# Patient Record
Sex: Female | Born: 1980 | Race: Black or African American | Hispanic: No | Marital: Single | Smoking: Current some day smoker
Health system: Southern US, Community
[De-identification: ages and names within clinical notes are randomized; demographics above are authoritative.]

---

## 2015-06-15 ENCOUNTER — Encounter (HOSPITAL_COMMUNITY): Payer: Self-pay | Admitting: Emergency Medicine

## 2015-06-15 ENCOUNTER — Emergency Department (HOSPITAL_COMMUNITY)
Admission: EM | Admit: 2015-06-15 | Discharge: 2015-06-16 | Disposition: A | Discharge status: Home / Self Care | Payer: Self-pay | Attending: Emergency Medicine | Admitting: Emergency Medicine

## 2015-06-15 DIAGNOSIS — N898 Other specified noninflammatory disorders of vagina: Secondary | ICD-10-CM | POA: Insufficient documentation

## 2015-06-15 DIAGNOSIS — R000 Tachycardia, unspecified: Secondary | ICD-10-CM | POA: Insufficient documentation

## 2015-06-15 DIAGNOSIS — F17200 Nicotine dependence, unspecified, uncomplicated: Secondary | ICD-10-CM | POA: Insufficient documentation

## 2015-06-15 DIAGNOSIS — B9789 Other viral agents as the cause of diseases classified elsewhere: Secondary | ICD-10-CM

## 2015-06-15 DIAGNOSIS — R300 Dysuria: Secondary | ICD-10-CM | POA: Insufficient documentation

## 2015-06-15 DIAGNOSIS — J069 Acute upper respiratory infection, unspecified: Secondary | ICD-10-CM | POA: Insufficient documentation

## 2015-06-15 DIAGNOSIS — Z3202 Encounter for pregnancy test, result negative: Secondary | ICD-10-CM | POA: Insufficient documentation

## 2015-06-15 DIAGNOSIS — R3915 Urgency of urination: Secondary | ICD-10-CM | POA: Insufficient documentation

## 2015-06-15 DIAGNOSIS — Z79899 Other long term (current) drug therapy: Secondary | ICD-10-CM | POA: Insufficient documentation

## 2015-06-15 LAB — URINALYSIS, ROUTINE W REFLEX MICROSCOPIC
BILIRUBIN URINE: NEGATIVE
GLUCOSE, UA: NEGATIVE mg/dL
HGB URINE DIPSTICK: NEGATIVE
KETONES UR: NEGATIVE mg/dL
Leukocytes, UA: NEGATIVE
NITRITE: NEGATIVE
Protein, ur: NEGATIVE mg/dL
Specific Gravity, Urine: 1.01 (ref 1.005–1.030)
pH: 5.5 (ref 5.0–8.0)

## 2015-06-15 LAB — POC URINE PREG, ED: Preg Test, Ur: NEGATIVE

## 2015-06-15 NOTE — ED Notes (Signed)
Patient arrives with complaint of urinary urgency, dysuria, and upper respiratory illness. Onset 3 days ago. Denies fever, but states she feels cold in triage.

## 2015-06-15 NOTE — ED Provider Notes (Signed)
CSN: [id number]     Arrival date & time 06/15/15  2202 History   First MD Initiated Contact with Patient 06/15/15 2335     Chief Complaint  Patient presents with  . Urinary Urgency  . Dysuria  . URI     (Consider location/radiation/quality/duration/timing/severity/associated sxs/prior Treatment) The history is provided by the patient and medical records. No language interpreter was used.     Sabrina Mercado is a 35 y.o. female  With no personal medical hx presents to the Emergency Department complaining of gradual, persistent, progressively worsening dysuria, urinary urgency onset 3 days ago. Patient reports she thought this was a urinary tract infection. She reports drinking cranberry juice and lots of water. She also reports inserting probiotic yogurt into her vagina and attempt to treat this possible UTI. Patient has one female sexual partner. No history of STDs.  Patient reports some vaginal discharge although she cannot remember if this was before or after the yogurt insertion.  She reports using water-based lubricant during sexual intercourse. She does report intermittent douche usage.  Pt also c/o gradual onset, persistent URI x 4 days with associated rhinorrhea, nonproductive cough, nasal congestion, denies and throbbing headache.  She has been taking Mucinex, decongestants and cough/cold medications without significant improvement. Patient reports that her partner had a similar illness several weeks ago.  She denies fever, chills, neck pain, neck stiffness, chest pain, shortness of breath.     History reviewed. No pertinent past medical history. History reviewed. No pertinent past surgical history. History reviewed. No pertinent family history. Social History  Substance Use Topics  . Smoking status: Current Some Day Smoker  . Smokeless tobacco: None  . Alcohol Use: No   OB History    No data available     Review of Systems  Constitutional: Negative for fever, chills,  diaphoresis, appetite change, fatigue and unexpected weight change.  HENT: Positive for congestion, postnasal drip, rhinorrhea, sinus pressure and sore throat. Negative for ear discharge, ear pain and mouth sores.   Eyes: Negative for visual disturbance.  Respiratory: Positive for cough (dry). Negative for chest tightness, shortness of breath, wheezing and stridor.   Cardiovascular: Negative for chest pain, palpitations and leg swelling.  Gastrointestinal: Negative for nausea, vomiting, abdominal pain, diarrhea and constipation.  Endocrine: Negative for polydipsia, polyphagia and polyuria.  Genitourinary: Positive for dysuria, urgency and vaginal discharge. Negative for frequency and hematuria.  Musculoskeletal: Negative for myalgias, back pain, arthralgias and neck stiffness.  Skin: Negative for rash.  Allergic/Immunologic: Negative for immunocompromised state.  Neurological: Positive for headaches. Negative for syncope, light-headedness and numbness.  Hematological: Negative for adenopathy. Does not bruise/bleed easily.  Psychiatric/Behavioral: Negative for sleep disturbance. The patient is not nervous/anxious.   All other systems reviewed and are negative.     Allergies  Review of patient's allergies indicates no known allergies.  Home Medications   Prior to Admission medications   Medication Sig Start Date End Date Taking? Authorizing Provider  Fexofenadine HCl (ALLEGRA PO) Take 1 tablet by mouth daily.   Yes Historical Provider, MD  fluconazole (DIFLUCAN) 150 MG tablet Take 1 tablet (150 mg total) by mouth once. In 72 hours if no improvement 06/16/15   Jia Dottavio, PA-C  metroNIDAZOLE (FLAGYL) 500 MG tablet Take 1 tablet (500 mg total) by mouth 2 (two) times daily. 06/16/15   Libia Fazzini, PA-C   BP 121/73 mmHg  Pulse 105  Temp(Src) 98.8 F (37.1 C) (Oral)  Resp 18  Ht 5' 5.5" (1.664 m)  Wt 79.379 kg  BMI 28.67 kg/m2  SpO2 100%  LMP 06/02/2015 (Exact  Date) Physical Exam  Constitutional: She is oriented to person, place, and time. She appears well-developed and well-nourished. No distress.  HENT:  Head: Normocephalic and atraumatic.  Right Ear: Tympanic membrane, external ear and ear canal normal.  Left Ear: Tympanic membrane, external ear and ear canal normal.  Nose: Mucosal edema and rhinorrhea present. No epistaxis. Right sinus exhibits no maxillary sinus tenderness and no frontal sinus tenderness. Left sinus exhibits no maxillary sinus tenderness and no frontal sinus tenderness.  Mouth/Throat: Uvula is midline and mucous membranes are normal. Mucous membranes are not pale and not cyanotic. No oropharyngeal exudate, posterior oropharyngeal edema, posterior oropharyngeal erythema or tonsillar abscesses.  Eyes: Conjunctivae are normal. Pupils are equal, round, and reactive to light.  Neck: Normal range of motion and full passive range of motion without pain.  Cardiovascular: Normal rate, regular rhythm, normal heart sounds and intact distal pulses.   No murmur heard. Pulmonary/Chest: Effort normal and breath sounds normal. No stridor. No respiratory distress. She has no wheezes.  Clear and equal breath sounds without focal wheezes, rhonchi, rales  Abdominal: Soft. Bowel sounds are normal. There is no tenderness. There is no rebound and no guarding. Hernia confirmed negative in the right inguinal area and confirmed negative in the left inguinal area.  Genitourinary: Uterus normal. No labial fusion. There is no rash, tenderness or lesion on the right labia. There is no rash, tenderness or lesion on the left labia. Uterus is not deviated, not enlarged, not fixed and not tender. Cervix exhibits no motion tenderness, no discharge and no friability. Right adnexum displays no mass, no tenderness and no fullness. Left adnexum displays no mass, no tenderness and no fullness. No erythema, tenderness or bleeding in the vagina. No foreign body around the  vagina. No signs of injury around the vagina. Vaginal discharge (thick, clumpy, white) found.  Musculoskeletal: Normal range of motion. She exhibits no edema.  Lymphadenopathy:    She has no cervical adenopathy.       Right: No inguinal adenopathy present.       Left: No inguinal adenopathy present.  Neurological: She is alert and oriented to person, place, and time.  Skin: Skin is warm and dry. No rash noted. She is not diaphoretic. No erythema.  Psychiatric: She has a normal mood and affect.  Nursing note and vitals reviewed.   ED Course  Procedures (including critical care time) Labs Review Labs Reviewed  WET PREP, GENITAL  URINALYSIS, ROUTINE W REFLEX MICROSCOPIC (NOT AT [facility])  POC URINE PREG, ED  GC/CHLAMYDIA PROBE AMP ([place]) NOT AT [facility]     MDM   Final diagnoses:  Viral URI with cough  Tachycardia  Vaginal discharge  Dysuria   Sabrina Mercado presents with several complaints.  Pt afebrile with clear and equal breath sounds; doubt PNA.  No CXR indicated at this time. Patients symptoms are consistent with URI, likely viral etiology. Discussed that antibiotics are not indicated for viral infections. Pt will be discharged with symptomatic treatment.  Verbalizes understanding and is agreeable with plan.  Patient with noted tachycardia and low 100s.  She has no risk factors for PE including no estrogen usage, swelling of either leg, hx of DVT, recent immobilization, surgery or fracture.  She denies CP or SOB.  Pt given fluid bolus.  NO hypoxia.    Pt also with c/o urinary urgency and dysuria.  UA without evidence of  UTI.  Pelvic exam with vaginal discharge consistent with yeast.  NO odor of BV.  Per RN, the top of the tube containing the wet prep became dislodged in transit and was therefore contaminated.  I discussed this with the patient and offered to recollect the sample.  She declines.  We have treated for yeast and this is present clinically.  I discussed the possibility of  BV but clinical exam more likely yeast.  Pt wishes to be treated for BV at this time without verification on wet prep.  Pt d/c home with flagyl.  Discussed importance of refraining from EtOH while on this medication.  [facility]    BP 122/72 mmHg  Pulse 104  Temp(Src) 98.8 F (37.1 C) (Oral)  Resp 16  Ht 5' 5.5" (1.664 m)  Wt 79.379 kg  BMI 28.67 kg/m2  SpO2 100%  LMP 06/02/2015 (Exact Date)   Dierdre ForthHannah Jamont Mellin, PA-C 06/16/15 [id number]  Alvira MondayErin Schlossman, MD 06/16/15 [phone]

## 2015-06-16 MED ORDER — FLUTICASONE PROPIONATE 50 MCG/ACT NA SUSP
2.0000 | Freq: Every day | NASAL | Status: DC
  Administered 2015-06-16: 2 via NASAL
  Filled 2015-06-16: qty 16

## 2015-06-16 MED ORDER — FLUCONAZOLE 100 MG PO TABS
150.0000 mg | ORAL_TABLET | Freq: Once | ORAL | Status: AC
  Administered 2015-06-16: 150 mg via ORAL
  Filled 2015-06-16: qty 2

## 2015-06-16 MED ORDER — FLUCONAZOLE 150 MG PO TABS: 150.0000 mg | ORAL_TABLET | Freq: Once | ORAL | Status: AC

## 2015-06-16 MED ORDER — METRONIDAZOLE 500 MG PO TABS: 500.0000 mg | ORAL_TABLET | Freq: Two times a day (BID) | ORAL | Status: AC

## 2015-06-16 MED ORDER — IBUPROFEN 400 MG PO TABS
400.0000 mg | ORAL_TABLET | Freq: Once | ORAL | Status: AC
  Administered 2015-06-16: 400 mg via ORAL
  Filled 2015-06-16: qty 1

## 2015-06-16 MED ORDER — SODIUM CHLORIDE 0.9 % IV BOLUS (SEPSIS)
1000.0000 mL | Freq: Once | INTRAVENOUS | Status: AC
  Administered 2015-06-16: 1000 mL via INTRAVENOUS

## 2015-06-16 NOTE — Discharge Instructions (Signed)
1. Medications: flonase, mucinex, diflucan, flagyl, usual home medications 2. Treatment: rest, drink plenty of fluids, take tylenol or ibuprofen for fever control 3. Follow Up: Please followup with your primary doctor in 3 days for discussion of your diagnoses and further evaluation after today's visit; if you do not have a primary care doctor use the resource guide provided to find one; Return to the ER for high fevers, difficulty breathing or other concerning symptoms   Bacterial Vaginosis Bacterial vaginosis is a vaginal infection that occurs when the normal balance of bacteria in the vagina is disrupted. It results from an overgrowth of certain bacteria. This is the most common vaginal infection in women of childbearing age. Treatment is important to prevent complications, especially in pregnant women, as it can cause a premature delivery. CAUSES  Bacterial vaginosis is caused by an increase in harmful bacteria that are normally present in smaller amounts in the vagina. Several different kinds of bacteria can cause bacterial vaginosis. However, the reason that the condition develops is not fully understood. RISK FACTORS Certain activities or behaviors can put you at an increased risk of developing bacterial vaginosis, including:  Having a new sex partner or multiple sex partners.  Douching.  Using an intrauterine device (IUD) for contraception. Women do not get bacterial vaginosis from toilet seats, bedding, swimming pools, or contact with objects around them. SIGNS AND SYMPTOMS  Some women with bacterial vaginosis have no signs or symptoms. Common symptoms include:  Grey vaginal discharge.  A fishlike odor with discharge, especially after sexual intercourse.  Itching or burning of the vagina and vulva.  Burning or pain with urination. DIAGNOSIS  Your health care provider will take a medical history and examine the vagina for signs of bacterial vaginosis. A sample of vaginal fluid  may be taken. Your health care provider will look at this sample under a microscope to check for bacteria and abnormal cells. A vaginal pH test may also be done.  TREATMENT  Bacterial vaginosis may be treated with antibiotic medicines. These may be given in the form of a pill or a vaginal cream. A second round of antibiotics may be prescribed if the condition comes back after treatment. Because bacterial vaginosis increases your risk for sexually transmitted diseases, getting treated can help reduce your risk for chlamydia, gonorrhea, HIV, and herpes. HOME CARE INSTRUCTIONS   Only take over-the-counter or prescription medicines as directed by your health care provider.  If antibiotic medicine was prescribed, take it as directed. Make sure you finish it even if you start to feel better.  Tell all sexual partners that you have a vaginal infection. They should see their health care provider and be treated if they have problems, such as a mild rash or itching.  During treatment, it is important that you follow these instructions:  Avoid sexual activity or use condoms correctly.  Do not douche.  Avoid alcohol as directed by your health care provider.  Avoid breastfeeding as directed by your health care provider. SEEK MEDICAL CARE IF:   Your symptoms are not improving after 3 days of treatment.  You have increased discharge or pain.  You have a fever. MAKE SURE YOU:   Understand these instructions.  Will watch your condition.  Will get help right away if you are not doing well or get worse. FOR MORE INFORMATION  Centers for Disease Control and Prevention, Division of STD Prevention: [employer] American Sexual Health Association (ASHA): www.ashastd.org    This information is not intended to  replace advice given to you by your health care provider. Make sure you discuss any questions you have with your health care provider.   Document Released: 02/16/2005 Document Revised:  03/09/2014 Document Reviewed: 09/28/2012 Elsevier Interactive Patient Education [employer]2016 Elsevier Inc.

## 2015-06-16 NOTE — ED Notes (Signed)
Pelvic cart available the the bedside.

## 2015-06-16 NOTE — ED Notes (Signed)
Wet prep was open on arrival to lab

## 2015-06-17 LAB — GC/CHLAMYDIA PROBE AMP (~~LOC~~) NOT AT ARMC
Chlamydia: NEGATIVE
NEISSERIA GONORRHEA: NEGATIVE

## 2015-07-18 ENCOUNTER — Emergency Department (HOSPITAL_COMMUNITY): Payer: Self-pay

## 2015-07-18 ENCOUNTER — Emergency Department (HOSPITAL_COMMUNITY)
Admission: EM | Admit: 2015-07-18 | Discharge: 2015-07-18 | Disposition: A | Discharge status: Home / Self Care | Payer: Self-pay | Attending: Emergency Medicine | Admitting: Emergency Medicine

## 2015-07-18 ENCOUNTER — Encounter (HOSPITAL_COMMUNITY): Payer: Self-pay | Admitting: Emergency Medicine

## 2015-07-18 DIAGNOSIS — Z79899 Other long term (current) drug therapy: Secondary | ICD-10-CM | POA: Insufficient documentation

## 2015-07-18 DIAGNOSIS — F17200 Nicotine dependence, unspecified, uncomplicated: Secondary | ICD-10-CM | POA: Insufficient documentation

## 2015-07-18 DIAGNOSIS — R519 Headache, unspecified: Secondary | ICD-10-CM

## 2015-07-18 DIAGNOSIS — R51 Headache: Secondary | ICD-10-CM | POA: Insufficient documentation

## 2015-07-18 MED ORDER — DIPHENHYDRAMINE HCL 50 MG/ML IJ SOLN
25.0000 mg | Freq: Once | INTRAMUSCULAR | Status: AC
  Administered 2015-07-18: 25 mg via INTRAVENOUS
  Filled 2015-07-18: qty 1

## 2015-07-18 MED ORDER — KETOROLAC TROMETHAMINE 30 MG/ML IJ SOLN
30.0000 mg | Freq: Once | INTRAMUSCULAR | Status: AC
  Administered 2015-07-18: 30 mg via INTRAVENOUS
  Filled 2015-07-18: qty 1

## 2015-07-18 MED ORDER — SODIUM CHLORIDE 0.9 % IV BOLUS (SEPSIS)
500.0000 mL | Freq: Once | INTRAVENOUS | Status: AC
  Administered 2015-07-18: 500 mL via INTRAVENOUS

## 2015-07-18 NOTE — ED Provider Notes (Signed)
CSN: [id number]     Arrival date & time 07/18/15  0930 History   First MD Initiated Contact with Patient 07/18/15 1001     Chief Complaint  Patient presents with  . Headache    (Consider location/radiation/quality/duration/timing/severity/associated sxs/prior Treatment) Patient is a 35 y.o. female presenting with headaches. The history is provided by the patient and medical records. No language interpreter was used.  Headache Associated symptoms: no abdominal pain, no back pain, no congestion, no cough, no dizziness, no fever, no nausea, no neck pain, no neck stiffness, no sore throat, no vomiting and no weakness    Alauna C Oxendine is a 35 y.o. female  who presents to the Emergency Department complaining of a constant headache over the last one month. Ranges from 6/10 - 10/10 but always present. No hx of headaches in the past. Ibuprofen, Tylenol, and Excedrin taken with relief for approximately 30 minutes, but then pain returns. Described as throbbing and generalized over entire head, worst frontal. Intermittent nausea but not at present, increase in stress recently. No visual changes, no gait abnormalities, no photophobia, no fever, no neck pain.   History reviewed. No pertinent past medical history. History reviewed. No pertinent past surgical history. No family history on file. Social History  Substance Use Topics  . Smoking status: Current Some Day Smoker  . Smokeless tobacco: None  . Alcohol Use: Yes   OB History    No data available     Review of Systems  Constitutional: Negative for fever and chills.  HENT: Negative for congestion and sore throat.   Eyes: Negative for visual disturbance.  Respiratory: Negative for cough and shortness of breath.   Cardiovascular: Negative.   Gastrointestinal: Negative for nausea, vomiting and abdominal pain.  Genitourinary: Negative for dysuria.  Musculoskeletal: Negative for back pain, neck pain and neck stiffness.  Skin: Negative for rash.   Neurological: Positive for headaches. Negative for dizziness, speech difficulty and weakness.      Allergies  Review of patient's allergies indicates no known allergies.  Home Medications   Prior to Admission medications   Medication Sig Start Date End Date Taking? Authorizing Provider  Fexofenadine HCl (ALLEGRA PO) Take 1 tablet by mouth daily.    Historical Provider, MD  fluconazole (DIFLUCAN) 150 MG tablet Take 1 tablet (150 mg total) by mouth once. In 72 hours if no improvement Patient not taking: Reported on 07/18/2015 06/16/15   Dahlia ClientHannah Muthersbaugh, PA-C  metroNIDAZOLE (FLAGYL) 500 MG tablet Take 1 tablet (500 mg total) by mouth 2 (two) times daily. Patient not taking: Reported on 07/18/2015 06/16/15   Dahlia ClientHannah Muthersbaugh, PA-C   BP 103/50 mmHg  Pulse 68  Temp(Src) 98.4 F (36.9 C) (Oral)  Resp 16  Wt 79.833 kg  SpO2 99%  LMP 06/29/2015 Physical Exam  Constitutional: She is oriented to person, place, and time. She appears well-developed and well-nourished. No distress.  HENT:  Head: Normocephalic and atraumatic.  Mouth/Throat: Oropharynx is clear and moist.  No tenderness of the temporal artery   Eyes: Conjunctivae and EOM are normal. Pupils are equal, round, and reactive to light. No scleral icterus.  No nystagmus   Neck: Normal range of motion. Neck supple.  Full active and passive ROM without pain.  No midline or paraspinal tenderness. No nuchal rigidity or meningeal signs.  Cardiovascular: Normal rate, regular rhythm, normal heart sounds and intact distal pulses.   Pulmonary/Chest: Effort normal and breath sounds normal. No respiratory distress. She has no wheezes. She has no  rales.  Abdominal: Soft. She exhibits no distension. There is no tenderness.  Musculoskeletal: Normal range of motion.  Lymphadenopathy:    She has no cervical adenopathy.  Neurological: She is alert and oriented to person, place, and time. She has normal reflexes. No cranial nerve deficit.  Coordination normal.  Alert, oriented, thought content appropriate, able to give a coherent history. Speech is clear and goal oriented, able to follow commands.  Cranial Nerves:  II:  Peripheral visual fields grossly normal, pupils equal, round, reactive to light III, IV, VI: EOM intact bilaterally, ptosis not present V,VII: smile symmetric, eyes kept closed tightly against resistance, facial light touch sensation equal VIII: hearing grossly normal IX, X: symmetric soft palate movement, uvula elevates symmetrically  XI: bilateral shoulder shrug symmetric and strong XII: midline tongue extension 5/5 muscle strength in upper and lower extremities bilaterally including strong and equal grip strength and dorsiflexion/plantar flexion Sensory to light touch normal in all four extremities.  Normal finger-to-nose and rapid alternating movements; normal gait and balance  Skin: Skin is warm and dry. No rash noted. She is not diaphoretic.  Psychiatric: She has a normal mood and affect. Her behavior is normal. Judgment and thought content normal.  Nursing note and vitals reviewed.   ED Course  Procedures (including critical care time) Labs Review Labs Reviewed - No data to display  Imaging Review Ct Head Wo Contrast  07/18/2015  CLINICAL DATA:  35 year old female with headache for 1 month. Initial encounter. EXAM: CT HEAD WITHOUT CONTRAST TECHNIQUE: Contiguous axial images were obtained from the base of the skull through the vertex without intravenous contrast. COMPARISON:  None. FINDINGS: Visualized paranasal sinuses and mastoids are clear. No osseous abnormality identified. No acute orbit or scalp soft tissue findings. Cerebral volume is normal. No midline shift, ventriculomegaly, mass effect, evidence of mass lesion, intracranial hemorrhage or evidence of cortically based acute infarction. Gray-white matter differentiation is within normal limits throughout the brain. Incidental small perivascular  space at the inferior left basal ganglia. No suspicious intracranial vascular hyperdensity. IMPRESSION: Normal noncontrast CT appearance of the brain. Electronically Signed   By: Odessa Fleming M.D.   On: 07/18/2015 11:12   I have personally reviewed and evaluated these images and lab results as part of my medical decision-making.   EKG Interpretation None      MDM   Final diagnoses:  Nonintractable headache, unspecified chronicity pattern, unspecified headache type   Deatrice C Swindle presents to ED for constant headache x 1 month. Pain will range from 6/10-10/10 but always present. No history of similar headaches. On exam, afebrile, VSS. No focal neuro deficits. No meningeal signs. Given duration of symptoms and no headache hx, will obtain CT head. Toradol and benadryl given for headache.   CT head unremarkable.   12:15 PM - Patient re-evaluated and states headache has resolved.   Follow up with PCP strongly encouraged. Return precautions given. All questions answered.   [facility] Steven Basso, PA-C 07/18/15 1237  Lavera Guise, MD 07/18/15 [phone]

## 2015-07-18 NOTE — Discharge Instructions (Signed)
1. Medications: continue usual home medications 2. Treatment: rest, drink plenty of fluids 3. Follow Up: Please follow up with your primary doctor in 1-2 weeks for discussion of your diagnoses and further evaluation after today's visit; Please return to the ER for double vision, speech difficulty, gait disturbance, persistent vomiting or other concerns.

## 2015-07-18 NOTE — ED Notes (Signed)
Onset one month ago constant headache pain range 6-10/10 achy throbbing currently 6/10 throbbing.

## 2015-10-19 ENCOUNTER — Emergency Department: Payer: Self-pay

## 2015-10-19 ENCOUNTER — Emergency Department
Admission: EM | Admit: 2015-10-19 | Discharge: 2015-10-19 | Disposition: A | Discharge status: Home / Self Care | Payer: Self-pay | Attending: Emergency Medicine | Admitting: Emergency Medicine

## 2015-10-19 ENCOUNTER — Encounter: Payer: Self-pay | Admitting: Emergency Medicine

## 2015-10-19 DIAGNOSIS — N12 Tubulo-interstitial nephritis, not specified as acute or chronic: Secondary | ICD-10-CM | POA: Insufficient documentation

## 2015-10-19 DIAGNOSIS — R109 Unspecified abdominal pain: Secondary | ICD-10-CM

## 2015-10-19 DIAGNOSIS — F17200 Nicotine dependence, unspecified, uncomplicated: Secondary | ICD-10-CM | POA: Insufficient documentation

## 2015-10-19 LAB — CBC WITH DIFFERENTIAL/PLATELET
Basophils Absolute: 0.1 10*3/uL (ref 0–0.1)
Basophils Relative: 1 %
Eosinophils Absolute: 0.1 10*3/uL (ref 0–0.7)
Eosinophils Relative: 1 %
HEMATOCRIT: 37.9 % (ref 35.0–47.0)
HEMOGLOBIN: 12.8 g/dL (ref 12.0–16.0)
LYMPHS ABS: 1.4 10*3/uL (ref 1.0–3.6)
Lymphocytes Relative: 12 %
MCH: 28.8 pg (ref 26.0–34.0)
MCHC: 33.8 g/dL (ref 32.0–36.0)
MCV: 85.3 fL (ref 80.0–100.0)
MONO ABS: 0.5 10*3/uL (ref 0.2–0.9)
MONOS PCT: 4 %
NEUTROS ABS: 9.7 10*3/uL — AB (ref 1.4–6.5)
NEUTROS PCT: 82 %
Platelets: 301 10*3/uL (ref 150–440)
RBC: 4.45 MIL/uL (ref 3.80–5.20)
RDW: 12.9 % (ref 11.5–14.5)
WBC: 11.6 10*3/uL — ABNORMAL HIGH (ref 3.6–11.0)

## 2015-10-19 LAB — URINALYSIS COMPLETE WITH MICROSCOPIC (ARMC ONLY)
BILIRUBIN URINE: NEGATIVE
GLUCOSE, UA: NEGATIVE mg/dL
KETONES UR: NEGATIVE mg/dL
NITRITE: NEGATIVE
Protein, ur: NEGATIVE mg/dL
Specific Gravity, Urine: 1.006 (ref 1.005–1.030)
pH: 8 (ref 5.0–8.0)

## 2015-10-19 LAB — COMPREHENSIVE METABOLIC PANEL
ALK PHOS: 67 U/L (ref 38–126)
ALT: 22 U/L (ref 14–54)
ANION GAP: 6 (ref 5–15)
AST: 27 U/L (ref 15–41)
Albumin: 4.1 g/dL (ref 3.5–5.0)
BILIRUBIN TOTAL: 0.3 mg/dL (ref 0.3–1.2)
BUN: 10 mg/dL (ref 6–20)
CALCIUM: 9.3 mg/dL (ref 8.9–10.3)
CO2: 26 mmol/L (ref 22–32)
Chloride: 106 mmol/L (ref 101–111)
Creatinine, Ser: 0.76 mg/dL (ref 0.44–1.00)
GLUCOSE: 96 mg/dL (ref 65–99)
Potassium: 4.1 mmol/L (ref 3.5–5.1)
Sodium: 138 mmol/L (ref 135–145)
TOTAL PROTEIN: 8.3 g/dL — AB (ref 6.5–8.1)

## 2015-10-19 LAB — LIPASE, BLOOD: LIPASE: 17 U/L (ref 11–51)

## 2015-10-19 LAB — POCT PREGNANCY, URINE: PREG TEST UR: NEGATIVE

## 2015-10-19 MED ORDER — KETOROLAC TROMETHAMINE 30 MG/ML IJ SOLN
30.0000 mg | Freq: Once | INTRAMUSCULAR | Status: AC
  Administered 2015-10-19: 30 mg via INTRAVENOUS
  Filled 2015-10-19: qty 1

## 2015-10-19 MED ORDER — DEXTROSE 5 % IV SOLN
1.0000 g | Freq: Once | INTRAVENOUS | Status: AC
  Administered 2015-10-19: 1 g via INTRAVENOUS
  Filled 2015-10-19: qty 10

## 2015-10-19 MED ORDER — ONDANSETRON HCL 4 MG/2ML IJ SOLN
4.0000 mg | Freq: Once | INTRAMUSCULAR | Status: AC
  Administered 2015-10-19: 4 mg via INTRAVENOUS
  Filled 2015-10-19: qty 2

## 2015-10-19 MED ORDER — ONDANSETRON HCL 4 MG/2ML IJ SOLN
INTRAMUSCULAR | Status: AC
  Filled 2015-10-19: qty 2

## 2015-10-19 MED ORDER — OXYCODONE-ACETAMINOPHEN 5-325 MG PO TABS: 1.0000 | ORAL_TABLET | Freq: Four times a day (QID) | ORAL | 0 refills | Status: AC | PRN

## 2015-10-19 MED ORDER — ONDANSETRON HCL 4 MG/2ML IJ SOLN
4.0000 mg | Freq: Once | INTRAMUSCULAR | Status: AC
  Administered 2015-10-19: 4 mg via INTRAVENOUS

## 2015-10-19 MED ORDER — SODIUM CHLORIDE 0.9 % IV BOLUS (SEPSIS)
1000.0000 mL | Freq: Once | INTRAVENOUS | Status: AC
  Administered 2015-10-19: 1000 mL via INTRAVENOUS

## 2015-10-19 MED ORDER — CEPHALEXIN 500 MG PO CAPS: 500.0000 mg | ORAL_CAPSULE | Freq: Three times a day (TID) | ORAL | 0 refills | Status: AC

## 2015-10-19 NOTE — ED Notes (Signed)
Family came out of room and stated that patient was nauseous. Dr. Pershing ProudSchaevitz aware.

## 2015-10-19 NOTE — ED Triage Notes (Signed)
Pt c/o left side flank pain and pressure when she urinates started last night.

## 2015-10-19 NOTE — ED Provider Notes (Signed)
[facility] Emergency Department Provider Note   ____________________________________________   First MD Initiated Contact with Patient 10/19/15 [phone]     (approximate)  I have reviewed the triage vital signs and the nursing notes.   HISTORY  Chief Complaint Flank Pain   HPI Sabrina Mercado is a 35 y.o. female with a history of a kidney stone as a teenager was presenting with sudden onset left flank pain which started at 3 AM. Says that the pain is constant and sharp. It is radiating around down to her left lower quadrant. She denies any urinary symptoms. Did not report any vaginal pain or discharge. Reported nausea but no vomiting.  Reviewed her records and she had a CAT scan 2016 her abdomen and pelvis which did not show any renal stones. History reviewed. No pertinent past medical history.  There are no active problems to display for this patient.   History reviewed. No pertinent surgical history.  Prior to Admission medications   Medication Sig Start Date End Date Taking? Authorizing Provider  Fexofenadine HCl (ALLEGRA PO) Take 1 tablet by mouth daily.    Historical Provider, MD  fluconazole (DIFLUCAN) 150 MG tablet Take 1 tablet (150 mg total) by mouth once. In 72 hours if no improvement Patient not taking: Reported on 07/18/2015 06/16/15   Dahlia Client Muthersbaugh, PA-C  metroNIDAZOLE (FLAGYL) 500 MG tablet Take 1 tablet (500 mg total) by mouth 2 (two) times daily. Patient not taking: Reported on 07/18/2015 06/16/15   Dahlia Client Muthersbaugh, PA-C    Allergies Review of patient's allergies indicates no known allergies.  No family history on file.  Social History Social History  Substance Use Topics  . Smoking status: Current Some Day Smoker  . Smokeless tobacco: Never Used  . Alcohol use Yes    Review of Systems Constitutional: No fever/chills Eyes: No visual changes. ENT: No sore throat. Cardiovascular: Denies chest pain. Respiratory: Denies  shortness of breath. Gastrointestinal: No abdominal pain.   no vomiting.  No diarrhea.  No constipation. Genitourinary: Negative for dysuria. Musculoskeletal: Left-sided back and flank pain Skin: Negative for rash. Neurological: Negative for headaches, focal weakness or numbness.  10-point ROS otherwise negative.  ____________________________________________   PHYSICAL EXAM:  VITAL SIGNS: ED Triage Vitals  Enc Vitals Group     BP 10/19/15 0731 126/81     Pulse Rate 10/19/15 0731 (!) 105     Resp 10/19/15 0731 20     Temp 10/19/15 0731 98.3 F (36.8 C)     Temp Source 10/19/15 0731 Oral     SpO2 10/19/15 0731 96 %     Weight 10/19/15 0732 180 lb (81.6 kg)     Height 10/19/15 0732 5\' 6"  (1.676 m)     Head Circumference --      Peak Flow --      Pain Score 10/19/15 0732 10     Pain Loc --      Pain Edu? --      Excl. in GC? --     Constitutional: Alert and oriented. Appears uncomfortable Eyes: Conjunctivae are normal. PERRL. EOMI. Head: Atraumatic. Nose: No congestion/rhinnorhea. Mouth/Throat: Mucous membranes are moist.   Neck: No stridor.   Cardiovascular: Normal rate, regular rhythm. Grossly normal heart sounds.   Respiratory: Normal respiratory effort.  No retractions. Lungs CTAB. Gastrointestinal: Soft With left upper as well as lower abdominal tenderness across lower abdomen. tenderness is moderate. No distention. Left-sided CVA tenderness to palpation. Musculoskeletal: No lower extremity tenderness nor  edema.  No joint effusions. Neurologic:  Normal speech and language. No gross focal neurologic deficits are appreciated.  Skin:  Skin is warm, dry and intact. No rash noted. Psychiatric: Mood and affect are normal. Speech and behavior are normal.  ____________________________________________   LABS (all labs ordered are listed, but only abnormal results are displayed)  Labs Reviewed  URINALYSIS COMPLETEWITH MICROSCOPIC ([facility] ONLY) - Abnormal; Notable for the  following:       Result Value   Color, Urine STRAW (*)    APPearance HAZY (*)    Hgb urine dipstick 2+ (*)    Leukocytes, UA 2+ (*)    Bacteria, UA RARE (*)    Squamous Epithelial / LPF 0-5 (*)    All other components within normal limits  CBC WITH DIFFERENTIAL/PLATELET - Abnormal; Notable for the following:    WBC 11.6 (*)    Neutro Abs 9.7 (*)    All other components within normal limits  COMPREHENSIVE METABOLIC PANEL - Abnormal; Notable for the following:    Total Protein 8.3 (*)    All other components within normal limits  LIPASE, BLOOD  POC URINE PREG, ED  POCT PREGNANCY, URINE   ____________________________________________  EKG   ____________________________________________  RADIOLOGY  CT RENAL STONE STUDY (Accession [id number]) (Order [id number])  Imaging  Date: 10/19/2015 Department: [facility] EMERGENCY DEPARTMENT Released By/Authorizing: Myrna Blazeravid Matthew Schaevitz, MD (auto-released)  PACS Images   Show images for CT RENAL STONE STUDY  Study Result   CLINICAL DATA:  Left-sided flank pain and pressure.  Dysuria.  EXAM: CT ABDOMEN AND PELVIS WITHOUT CONTRAST  TECHNIQUE: Multidetector CT imaging of the abdomen and pelvis was performed following the standard protocol without IV contrast.  COMPARISON:  None.  FINDINGS: Lower chest: Clear lung bases. Normal heart size without pericardial or pleural effusion.  Hepatobiliary: Normal liver. Normal gallbladder, without biliary ductal dilatation.  Pancreas: Normal, without mass or ductal dilatation.  Spleen: Normal in size, without focal abnormality.  Adrenals/Urinary Tract: Normal adrenal glands. No renal calculi or hydronephrosis. No hydroureter or ureteric calculi. No bladder calculi.  Stomach/Bowel: Normal stomach, without wall thickening. Colonic stool burden suggests constipation. Normal terminal ileum and appendix. Normal small bowel.  Vascular/Lymphatic: Normal  caliber of the aorta and branch vessels. No abdominopelvic adenopathy.  Reproductive: Normal uterus and adnexa.  Other: No significant free fluid.  Musculoskeletal: No acute osseous abnormality.  IMPRESSION: 1.  No urinary tract calculi or hydronephrosis. 2.  Possible constipation.   Electronically Signed   By: Jeronimo GreavesKyle  Talbot M.D.   On: 10/19/2015 11:22     ____________________________________________   PROCEDURES  Procedure(s) performed:   Procedures  Critical Care performed:   ____________________________________________   INITIAL IMPRESSION / ASSESSMENT AND PLAN / ED COURSE  Pertinent labs & imaging results that were available during my care of the patient were reviewed by me and considered in my medical decision making (see chart for details).  ----------------------------------------- 12:29 PM on 10/19/2015 -----------------------------------------  Patient is resting comfortably after morphine. Found to have UTI but without any stones per likely pyelonephritis causing the patient's condition today. I explained the diagnosis as well as treatment plan to the patient and she is understanding and one to comply. She'll be discharged home with Keflex as well as Percocet. She will be given follow-up with [facility].  Clinical Course     ____________________________________________   FINAL CLINICAL IMPRESSION(S) / ED DIAGNOSES  Final diagnoses:  Left flank pain  Pyelonephritis.  NEW MEDICATIONS STARTED DURING THIS VISIT:  New Prescriptions   No medications on file     Note:  This document was prepared using Dragon voice recognition software and may include unintentional dictation errors.    Myrna Blazeravid Matthew Schaevitz, MD 10/19/15 1230

## 2016-09-02 IMAGING — CT CT RENAL STONE PROTOCOL
3 of 4 series · 9 of 46 positions shown, 16 images · non-contrast
Comparison: None.

CLINICAL DATA: Left-sided flank pain and pressure.  Dysuria.

EXAM:
CT ABDOMEN AND PELVIS WITHOUT CONTRAST
TECHNIQUE: Multidetector CT imaging of the abdomen and pelvis was performed
following the standard protocol without IV contrast.

[Series 4: lung windows · axial · 0.68mm/px · z∈[-167,-82]mm · 5 of 27 slices shown, 10 images]
[im 5/27  soft-tissue]
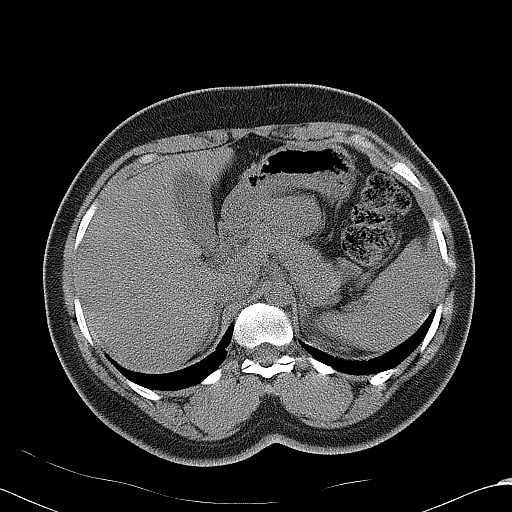
[im 5/27  bone]
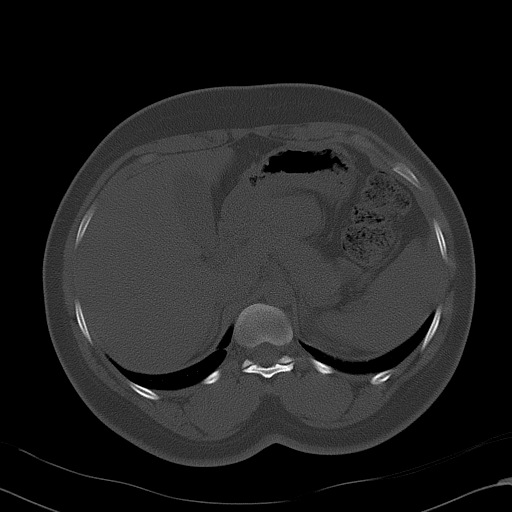
[im 9/27  soft-tissue]
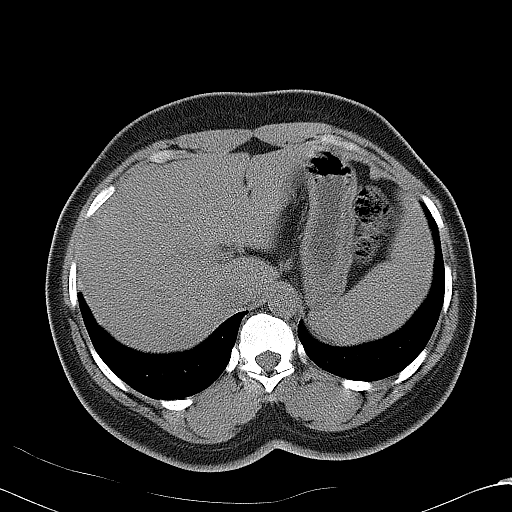
[im 9/27  lung]
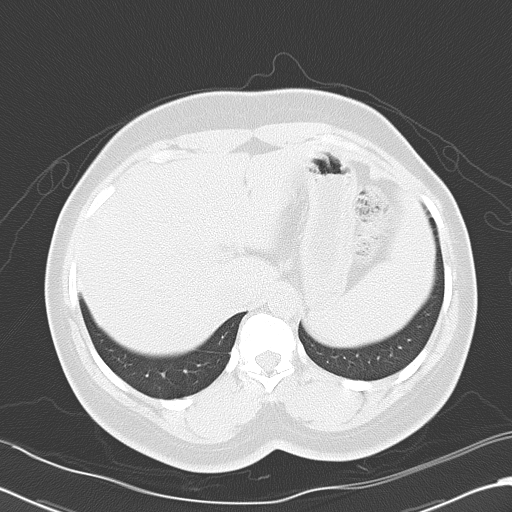
[im 14/27  soft-tissue]
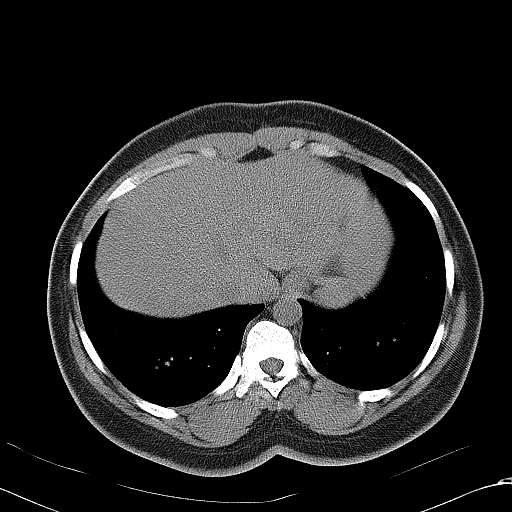
[im 14/27  lung]
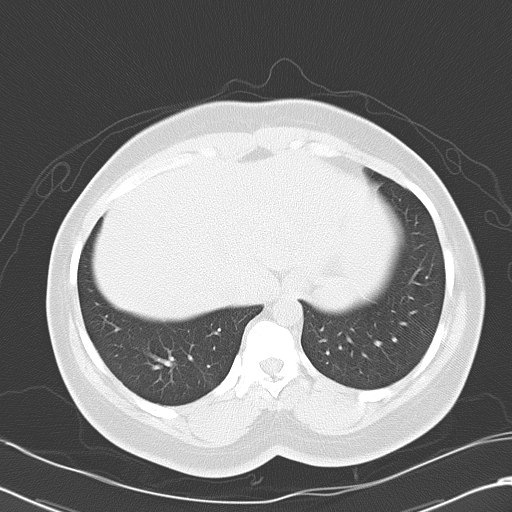
[im 18/27  soft-tissue]
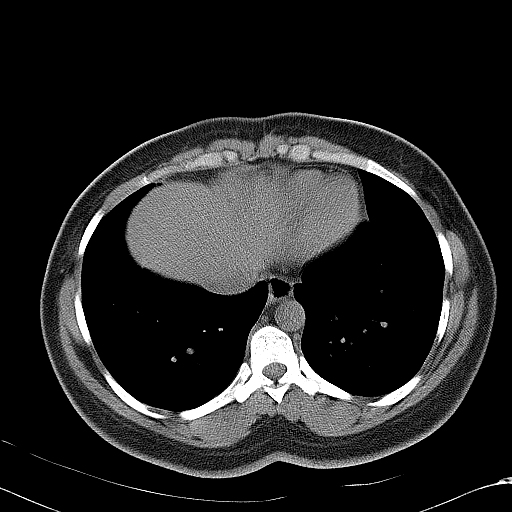
[im 18/27  lung]
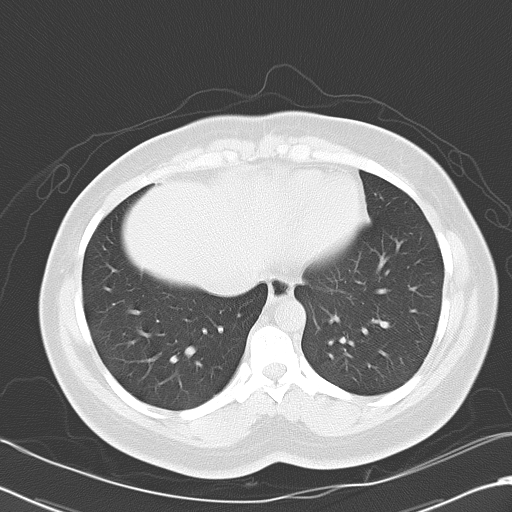
[im 22/27  soft-tissue]
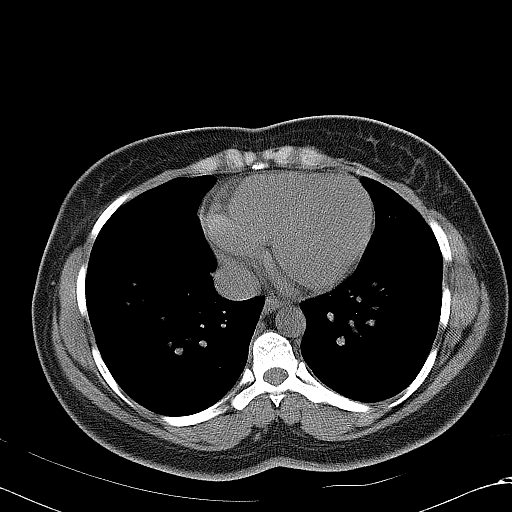
[im 22/27  lung]
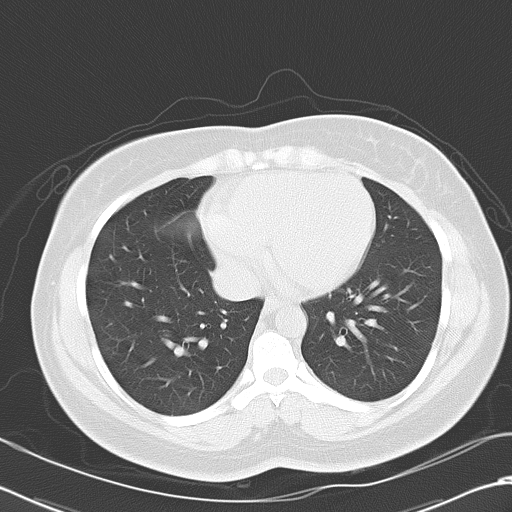

[Series 602: coronal · coronal · 0.94mm/px · 3 of 50 slices shown, 4 images]
[im 17/50  soft-tissue]
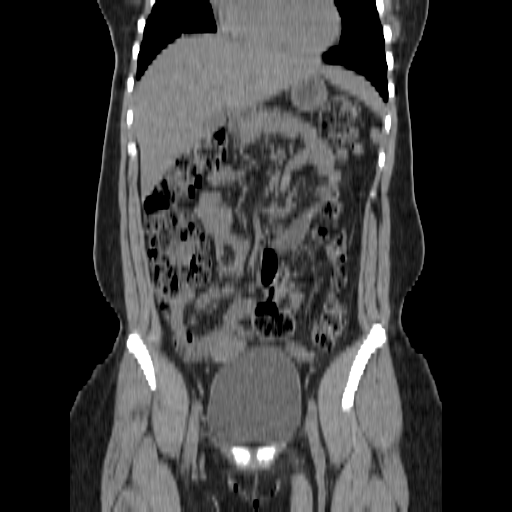
[im 22/50  soft-tissue]
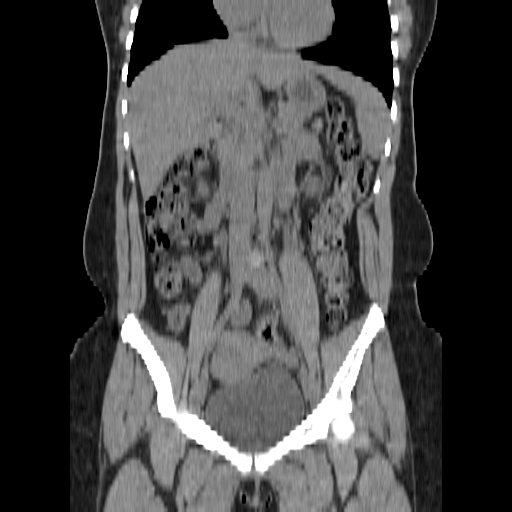
[im 22/50  bone]
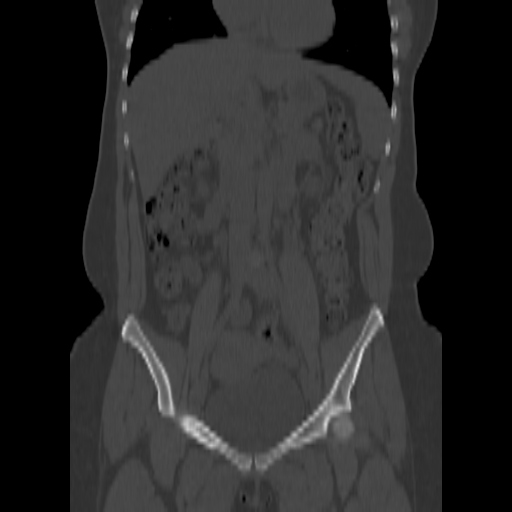
[im 28/50  soft-tissue]
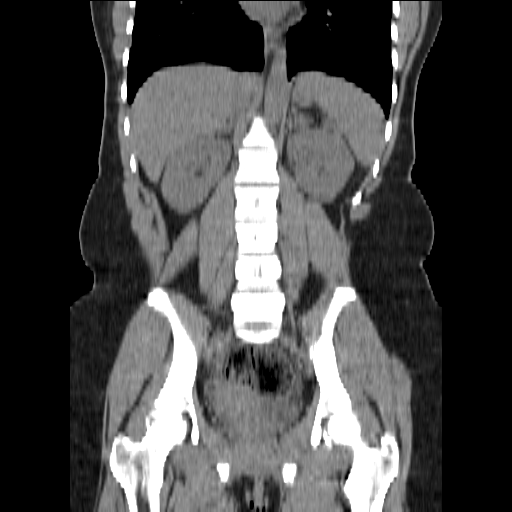

[Series 603: sagittal · sagittal · 0.94mm/px · 1 of 60 slices shown, 2 images]
[im 20/60  soft-tissue]
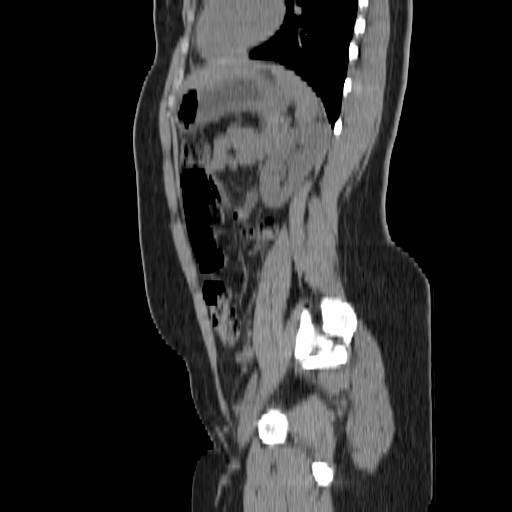
[im 20/60  bone]
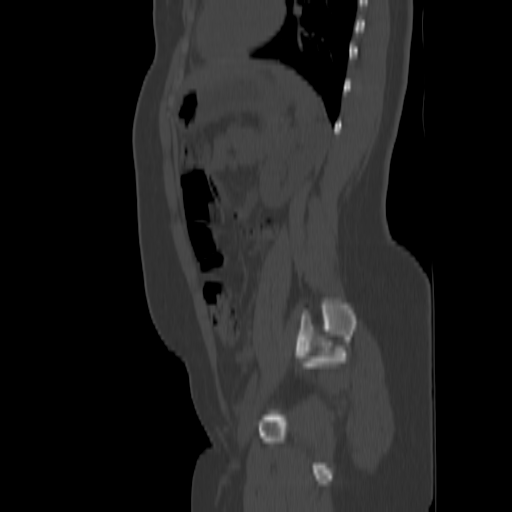

[9 of 46 positions shown; findings below may reference images not displayed]

FINDINGS: Lower chest: Clear lung bases. Normal heart size without pericardial
or pleural effusion.

Hepatobiliary: Normal liver. Normal gallbladder, without biliary
ductal dilatation.

Pancreas: Normal, without mass or ductal dilatation.

Spleen: Normal in size, without focal abnormality.

Adrenals/Urinary Tract: Normal adrenal glands. No renal calculi or
hydronephrosis. No hydroureter or ureteric calculi. No bladder
calculi.

Stomach/Bowel: Normal stomach, without wall thickening. Colonic
stool burden suggests constipation. Normal terminal ileum and
appendix. Normal small bowel.

Vascular/Lymphatic: Normal caliber of the aorta and branch vessels.
No abdominopelvic adenopathy.

Reproductive: Normal uterus and adnexa.

Other: No significant free fluid.

Musculoskeletal: No acute osseous abnormality.
IMPRESSION: 1.  No urinary tract calculi or hydronephrosis.
2.  Possible constipation.
